# Patient Record
Sex: Male | Born: 1990 | Race: White | Hispanic: No | Marital: Married | Smoking: Never smoker
Health system: Southern US, Community
[De-identification: ages and names within clinical notes are randomized; demographics above are authoritative.]

## PROBLEM LIST (undated history)

## (undated) DIAGNOSIS — L309 Dermatitis, unspecified: Secondary | ICD-10-CM

---

## 2013-03-16 ENCOUNTER — Encounter (HOSPITAL_COMMUNITY): Payer: Self-pay | Admitting: Emergency Medicine

## 2013-03-16 ENCOUNTER — Emergency Department (INDEPENDENT_AMBULATORY_CARE_PROVIDER_SITE_OTHER)
Admission: EM | Admit: 2013-03-16 | Discharge: 2013-03-16 | Disposition: A | Discharge status: Home / Self Care | Payer: BC Managed Care – PPO | Source: Home / Self Care | Attending: Emergency Medicine | Admitting: Emergency Medicine

## 2013-03-16 DIAGNOSIS — M545 Low back pain, unspecified: Secondary | ICD-10-CM

## 2013-03-16 LAB — POCT URINALYSIS DIP (DEVICE)
Glucose, UA: NEGATIVE mg/dL
Hgb urine dipstick: NEGATIVE
Ketones, ur: >=160 mg/dL — AB
Leukocytes, UA: NEGATIVE
Nitrite: NEGATIVE
Protein, ur: NEGATIVE mg/dL
Specific Gravity, Urine: 1.025 (ref 1.005–1.030)
Urobilinogen, UA: 0.2 mg/dL (ref 0.0–1.0)
pH: 6 (ref 5.0–8.0)

## 2013-03-16 MED ORDER — HYDROCODONE-ACETAMINOPHEN 5-325 MG PO TABS
2.0000 | ORAL_TABLET | Freq: Once | ORAL | Status: AC
  Administered 2013-03-16: 2 via ORAL

## 2013-03-16 MED ORDER — CYCLOBENZAPRINE HCL 5 MG PO TABS: 5.0000 mg | ORAL_TABLET | Freq: Three times a day (TID) | ORAL | Status: DC | PRN

## 2013-03-16 MED ORDER — KETOROLAC TROMETHAMINE 60 MG/2ML IM SOLN
60.0000 mg | Freq: Once | INTRAMUSCULAR | Status: AC
  Administered 2013-03-16: 60 mg via INTRAMUSCULAR

## 2013-03-16 MED ORDER — MELOXICAM 15 MG PO TABS: 15.0000 mg | ORAL_TABLET | Freq: Every day | ORAL | Status: DC

## 2013-03-16 MED ORDER — OXYCODONE-ACETAMINOPHEN 5-325 MG PO TABS: ORAL_TABLET | ORAL | Status: DC

## 2013-03-16 MED ORDER — HYDROCODONE-ACETAMINOPHEN 5-325 MG PO TABS
ORAL_TABLET | ORAL | Status: AC
  Filled 2013-03-16: qty 2

## 2013-03-16 MED ORDER — KETOROLAC TROMETHAMINE 60 MG/2ML IM SOLN
INTRAMUSCULAR | Status: AC
  Filled 2013-03-16: qty 2

## 2013-03-16 NOTE — Discharge Instructions (Signed)
Do exercises twice daily followed by moist heat for 15 minutes. ° ° ° ° ° °Try to be as active as possible. ° °If no better in 2 weeks, follow up with orthopedist. ° ° °

## 2013-03-16 NOTE — ED Provider Notes (Signed)
Chief Complaint   Chief Complaint  Patient presents with  . 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 He denies any specific injury. The pain does not radiate. There is no numbness, tingling, weakness in the lower extremities. It hurts to bend. He denies any bladder or bowel dysfunction, abdominal pain, fever, weight loss. He has had some chills and sweats and noted a small amount of staining with urination but no blood in the urine or frequency. He does not have any prior history of back problems.  Review of Systems   Other than as noted above, the patient denies any of the following symptoms: Systemic:  No fever, chills, or unexplained weight loss. GI:  No abdominal painor incontinence of bowel. GU:  No dysuria, frequency, urgency, or hematuria. No incontinence of urine or urinary retention.  M-S:  No neck pain or arthritis. Neuro:  No paresthesias, headache, saddle anesthesia, muscular weakness, or progressive neurological deficit.  PMFSH   Past medical history, family history, social history, meds, and allergies were reviewed. Specifically, there is no history of cancer, major trauma, osteoporosis, immunosuppression, or HIV infection.   Physical Examination    Vital signs:  BP 128/76  Pulse 101  Temp(Src) 98.2 F (36.8 C) (Oral)  Resp 18  SpO2 100% General:  Alert, oriented, in no distress. Abdomen:  Soft, non-tender.  No organomegaly or mass.  No pulsatile midline abdominal mass or bruit. Back:  There is localized tenderness to palpation in the midline and lower back at the L4-S1 level. There is no swelling. His back has a limited range of motion with 15 of flexion, 10 of extension, 15 of lateral flexion to the right, 30 of lateral flexion to the left, and 30 of rotation in either direction straight leg raising revealed tight muscles and pain in the  lower back on the left side. Was completely negative on the right. Neuro:  Normal muscle strength, sensations and DTRs. Extremities: Pedal pulses were full, there was no edema. Skin:  Clear, warm and dry.  No rash.  Labs   Results for orders placed during the hospital encounter of 03/16/13  POCT URINALYSIS DIP (DEVICE)      Result Value Ref Range   Glucose, UA NEGATIVE  NEGATIVE mg/dL   Bilirubin Urine MODERATE (*) NEGATIVE   Ketones, ur >=160 (*) NEGATIVE mg/dL   Specific Gravity, Urine 1.025  1.005 - 1.030   Hgb urine dipstick NEGATIVE  NEGATIVE   pH 6.0  5.0 - 8.0   Protein, ur NEGATIVE  NEGATIVE mg/dL   Urobilinogen, UA 0.2  0.0 - 1.0 mg/dL   Nitrite NEGATIVE  NEGATIVE   Leukocytes, UA NEGATIVE  NEGATIVE    Course in [facility]gent [facility]   Given Toradol 60 mg IM and Norco 5/325 2 by mouth for pain.    Assessment   The encounter diagnosis was Lumbago.  No evidence of cauda equina syndrome.  Plan     1.  Meds:  The following meds were prescribed:   Discharge Medication List as of 03/16/2013  7:51 PM    START taking these medications   Details  cyclobenzaprine (FLEXERIL) 5 MG tablet Take 1 tablet (5 mg total) by mouth 3 (three) times daily as needed for muscle spasms., Starting 03/16/2013, Until Discontinued, Normal    meloxicam (MOBIC) 15 MG tablet Take 1 tablet (15  mg total) by mouth daily., Starting 03/16/2013, Until Discontinued, Normal    oxyCODONE-acetaminophen (PERCOCET) 5-325 MG per tablet 1 to 2 tablets every 6 hours as needed for pain., Print        2.  Patient Education/Counseling:  The patient was given appropriate handouts, self care instructions, and instructed in symptomatic relief. The patient was encouraged to try to be as active as possible and given some exercises to do followed by moist heat.  3.  Follow up:  The patient was told to follow up here if no better in 3 to 4 days, or sooner if becoming worse in any way, and given some red flag symptoms such  as worsening pain or new neurological symptoms which would prompt immediate return.  Follow up with Dr. Myrene Galas if no better in 2 weeks.     Reuben Likes, MD 03/16/13 2007

## 2013-03-16 NOTE — ED Notes (Signed)
Pt triaged and assessed by provider.   Provider in before nurse. 

## 2015-06-16 ENCOUNTER — Encounter (HOSPITAL_COMMUNITY): Payer: Self-pay | Admitting: Emergency Medicine

## 2015-06-16 ENCOUNTER — Emergency Department (HOSPITAL_COMMUNITY): Payer: BLUE CROSS/BLUE SHIELD

## 2015-06-16 ENCOUNTER — Emergency Department (HOSPITAL_COMMUNITY)
Admission: EM | Admit: 2015-06-16 | Discharge: 2015-06-16 | Disposition: A | Discharge status: Home / Self Care | Payer: BLUE CROSS/BLUE SHIELD | Attending: Emergency Medicine | Admitting: Emergency Medicine

## 2015-06-16 DIAGNOSIS — Y9234 Swimming pool (public) as the place of occurrence of the external cause: Secondary | ICD-10-CM | POA: Insufficient documentation

## 2015-06-16 DIAGNOSIS — S0093XA Contusion of unspecified part of head, initial encounter: Secondary | ICD-10-CM

## 2015-06-16 DIAGNOSIS — Y999 Unspecified external cause status: Secondary | ICD-10-CM | POA: Insufficient documentation

## 2015-06-16 DIAGNOSIS — Z79899 Other long term (current) drug therapy: Secondary | ICD-10-CM | POA: Insufficient documentation

## 2015-06-16 DIAGNOSIS — S0990XA Unspecified injury of head, initial encounter: Secondary | ICD-10-CM | POA: Diagnosis present

## 2015-06-16 DIAGNOSIS — Y939 Activity, unspecified: Secondary | ICD-10-CM | POA: Insufficient documentation

## 2015-06-16 DIAGNOSIS — S01511A Laceration without foreign body of lip, initial encounter: Secondary | ICD-10-CM | POA: Insufficient documentation

## 2015-06-16 DIAGNOSIS — S022XXA Fracture of nasal bones, initial encounter for closed fracture: Secondary | ICD-10-CM | POA: Insufficient documentation

## 2015-06-16 MED ORDER — LIDOCAINE HCL 1 % IJ SOLN
5.0000 mL | Freq: Once | INTRAMUSCULAR | Status: AC
  Administered 2015-06-16: 5 mL
  Filled 2015-06-16: qty 20

## 2015-06-16 MED ORDER — HYDROCODONE-ACETAMINOPHEN 5-325 MG PO TABS: 2.0000 | ORAL_TABLET | ORAL | Status: DC | PRN

## 2015-06-16 MED ORDER — LIDOCAINE-EPINEPHRINE-TETRACAINE (LET) SOLUTION
3.0000 mL | Freq: Once | NASAL | Status: AC
  Administered 2015-06-16: 3 mL via TOPICAL
  Filled 2015-06-16 (×2): qty 3

## 2015-06-16 MED ORDER — HYDROCODONE-ACETAMINOPHEN 5-325 MG PO TABS
1.0000 | ORAL_TABLET | Freq: Once | ORAL | Status: AC
  Administered 2015-06-16: 1 via ORAL
  Filled 2015-06-16: qty 1
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
## 2015-06-16 NOTE — Discharge Instructions (Signed)
Medications: Norco  Treatment: Try to keep your lip clean and dry for the first 24 hours. After the first 24 hours, wash with warm soapy water. Apply bacitracin ointment 1-2 times daily. Take ibuprofen every 4-6 hours as needed for more mild pain. Take Norco every 4-6 hours as needed for more severe pain.  Follow-up: Please see you doctor or return to emergency department in 5-7 days to have your sutures removed. Please return to emergency department if he develop any new or worsening symptoms.   Mouth Laceration A mouth laceration is a deep cut in the lining of your mouth (mucosa). The laceration may extend into your lip or go all of the way through your mouth and cheek. Lacerations inside your mouth may involve your tongue, the insides of your cheeks, or the upper surface of your mouth (palate). Mouth lacerations may bleed a lot because your mouth has a very rich blood supply. Mouth lacerations may need to be repaired with stitches (sutures). CAUSES Any type of facial injury can cause a mouth laceration. Common causes include:  Getting hit in the mouth.  Being in a car accident. SYMPTOMS The most common sign of a mouth laceration is bleeding that fills the mouth. DIAGNOSIS Your health care provider can diagnose a mouth laceration by examining your mouth. Your mouth may need to be washed out (irrigated) with a sterile salt-water (saline) solution. Your health care provider may also have to remove any blood clots to determine how bad your injury is. You may need X-rays of the bones in your jaw or your face to rule out other injuries, such as dental injuries, facial fractures, or jaw fractures. TREATMENT Treatment depends on the location and severity of your injury. Small mouth lacerations may not need treatment if bleeding has stopped. You may need sutures if:  You have a tongue laceration.  Your mouth laceration is large or deep, or it continues to bleed. If sutures are necessary, your  health care provider will use absorbable sutures that dissolve as your body heals. You may also receive antibiotic medicine or a tetanus shot. HOME CARE INSTRUCTIONS  Take medicines only as directed by your health care provider.  If you were prescribed an antibiotic medicine, finish all of it even if you start to feel better.  Eat as directed by your health care provider. You may only be able to drink liquids or eat soft foods for a few days.  Rinse your mouth with a warm, salt-water rinse 4-6 times per day or as directed by your health care provider. You can make a salt-water rinse by mixing one tsp of salt into two cups of warm water.  Do not poke the sutures with your tongue. Doing that can loosen them.  Check your wound every day for signs of infection. It is normal to have a white or gray patch over your wound while it heals. Watch for:  Redness.  Swelling.  Blood or pus.  Maintain regular oral hygiene, if possible. Gently brush your teeth with a soft, nylon-bristled toothbrush 2 times per day.  Keep all follow-up visits as directed by your health care provider. This is important. SEEK MEDICAL CARE IF:  You were given a tetanus shot and have swelling, severe pain, redness, or bleeding at the injection site.  You have a fever.  Your pain is not controlled with medicine.  You have redness, swelling, or pain at your wound that is getting worse.  You have fresh bleeding or pus coming  from your wound.  The edges of your wound break open.  You develop swollen, tender glands in your throat. SEEK IMMEDIATE MEDICAL CARE IF:   Your face or the area under your jaw becomes swollen.  You have trouble breathing or swallowing.   This information is not intended to replace advice given to you by your health care provider. Make sure you discuss any questions you have with your health care provider.   Document Released: 01/17/2005 Document Revised: 06/03/2014 Document Reviewed:  01/08/2014 Elsevier Interactive Patient Education 2016 [employer]  Laceration Care, Adult A laceration is a cut that goes through all of the layers of the skin and into the tissue that is right under the skin. Some lacerations heal on their own. Others need to be closed with stitches (sutures), staples, skin adhesive strips, or skin glue. Proper laceration care minimizes the risk of infection and helps the laceration to heal better. HOW TO CARE FOR YOUR LACERATION If sutures or staples were used:  Keep the wound clean and dry.  If you were given a bandage (dressing), you should change it at least one time per day or as told by your health care provider. You should also change it if it becomes wet or dirty.  Keep the wound completely dry for the first 24 hours or as told by your health care provider. After that time, you may shower or bathe. However, make sure that the wound is not soaked in water until after the sutures or staples have been removed.  Clean the wound one time each day or as told by your health care provider:  Wash the wound with soap and water.  Rinse the wound with water to remove all soap.  Pat the wound dry with a clean towel. Do not rub the wound.  After cleaning the wound, apply a thin layer of antibiotic ointmentas told by your health care provider. This will help to prevent infection and keep the dressing from sticking to the wound.  Have the sutures or staples removed as told by your health care provider. If skin adhesive strips were used:  Keep the wound clean and dry.  If you were given a bandage (dressing), you should change it at least one time per day or as told by your health care provider. You should also change it if it becomes dirty or wet.  Do not get the skin adhesive strips wet. You may shower or bathe, but be careful to keep the wound dry.  If the wound gets wet, pat it dry with a clean towel. Do not rub the wound.  Skin adhesive strips  fall off on their own. You may trim the strips as the wound heals. Do not remove skin adhesive strips that are still stuck to the wound. They will fall off in time. If skin glue was used:  Try to keep the wound dry, but you may briefly wet it in the shower or bath. Do not soak the wound in water, such as by swimming.  After you have showered or bathed, gently pat the wound dry with a clean towel. Do not rub the wound.  Do not do any activities that will make you sweat heavily until the skin glue has fallen off on its own.  Do not apply liquid, cream, or ointment medicine to the wound while the skin glue is in place. Using those may loosen the film before the wound has healed.  If you were given a bandage (dressing),  you should change it at least one time per day or as told by your health care provider. You should also change it if it becomes dirty or wet.  If a dressing is placed over the wound, be careful not to apply tape directly over the skin glue. Doing that may cause the glue to be pulled off before the wound has healed.  Do not pick at the glue. The skin glue usually remains in place for 5-10 days, then it falls off of the skin. General Instructions  Take over-the-counter and prescription medicines only as told by your health care provider.  If you were prescribed an antibiotic medicine or ointment, take or apply it as told by your doctor. Do not stop using it even if your condition improves.  To help prevent scarring, make sure to cover your wound with sunscreen whenever you are outside after stitches are removed, after adhesive strips are removed, or when glue remains in place and the wound is healed. Make sure to wear a sunscreen of at least 30 SPF.  Do not scratch or pick at the wound.  Keep all follow-up visits as told by your health care provider. This is important.  Check your wound every day for signs of infection. Watch for:  Redness, swelling, or pain.  Fluid, blood,  or pus.  Raise (elevate) the injured area above the level of your heart while you are sitting or lying down, if possible. SEEK MEDICAL CARE IF:  You received a tetanus shot and you have swelling, severe pain, redness, or bleeding at the injection site.  You have a fever.  A wound that was closed breaks open.  You notice a bad smell coming from your wound or your dressing.  You notice something coming out of the wound, such as wood or glass.  Your pain is not controlled with medicine.  You have increased redness, swelling, or pain at the site of your wound.  You have fluid, blood, or pus coming from your wound.  You notice a change in the color of your skin near your wound.  You need to change the dressing frequently due to fluid, blood, or pus draining from the wound.  You develop a new rash.  You develop numbness around the wound. SEEK IMMEDIATE MEDICAL CARE IF:  You develop severe swelling around the wound.  Your pain suddenly increases and is severe.  You develop painful lumps near the wound or on skin that is anywhere on your body.  You have a red streak going away from your wound.  The wound is on your hand or foot and you cannot properly move a finger or toe.  The wound is on your hand or foot and you notice that your fingers or toes look pale or bluish.   This information is not intended to replace advice given to you by your health care provider. Make sure you discuss any questions you have with your health care provider.   Document Released: 01/17/2005 Document Revised: 06/03/2014 Document Reviewed: 01/13/2014 Elsevier Interactive Patient Education [employer]
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
## 2015-08-29 ENCOUNTER — Encounter (HOSPITAL_COMMUNITY): Payer: Self-pay | Admitting: *Deleted

## 2015-08-29 ENCOUNTER — Ambulatory Visit (HOSPITAL_COMMUNITY)
Admission: EM | Admit: 2015-08-29 | Discharge: 2015-08-29 | Disposition: A | Discharge status: Home / Self Care | Payer: BLUE CROSS/BLUE SHIELD | Attending: Emergency Medicine | Admitting: Emergency Medicine

## 2015-08-29 DIAGNOSIS — E860 Dehydration: Secondary | ICD-10-CM | POA: Diagnosis not present

## 2015-08-29 MED ORDER — IBUPROFEN 800 MG PO TABS
ORAL_TABLET | ORAL | Status: AC
  Filled 2015-08-29: qty 1

## 2015-08-29 MED ORDER — ONDANSETRON HCL 4 MG/2ML IJ SOLN
INTRAMUSCULAR | Status: AC
  Filled 2015-08-29: qty 2

## 2015-08-29 MED ORDER — ONDANSETRON HCL 4 MG/2ML IJ SOLN: 4.0000 mg | Freq: Once | INTRAMUSCULAR | Status: DC

## 2015-08-29 MED ORDER — ONDANSETRON HCL 4 MG/2ML IJ SOLN
4.0000 mg | Freq: Once | INTRAMUSCULAR | Status: AC
  Administered 2015-08-29: 4 mg via INTRAVENOUS

## 2015-08-29 MED ORDER — IBUPROFEN 800 MG PO TABS
800.0000 mg | ORAL_TABLET | Freq: Once | ORAL | Status: AC
  Administered 2015-08-29: 800 mg via ORAL

## 2015-08-29 MED ORDER — ONDANSETRON 8 MG PO TBDP: 8.0000 mg | ORAL_TABLET | Freq: Three times a day (TID) | ORAL | 0 refills | Status: DC | PRN

## 2015-08-29 MED ORDER — SODIUM CHLORIDE 0.9 % IV BOLUS (SEPSIS)
1000.0000 mL | Freq: Once | INTRAVENOUS | Status: AC
  Administered 2015-08-29: 1000 mL via INTRAVENOUS

## 2015-08-29 NOTE — ED Triage Notes (Signed)
Pt  Reports    Feels    Weak    No   Vomiting  No  Diarrhea
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
## 2020-07-29 ENCOUNTER — Emergency Department (INDEPENDENT_AMBULATORY_CARE_PROVIDER_SITE_OTHER): Payer: Managed Care, Other (non HMO)

## 2020-07-29 ENCOUNTER — Emergency Department (INDEPENDENT_AMBULATORY_CARE_PROVIDER_SITE_OTHER)
Admission: EM | Admit: 2020-07-29 | Discharge: 2020-07-29 | Disposition: A | Discharge status: Home / Self Care | Payer: Managed Care, Other (non HMO) | Source: Home / Self Care

## 2020-07-29 ENCOUNTER — Other Ambulatory Visit: Payer: Self-pay

## 2020-07-29 DIAGNOSIS — M62830 Muscle spasm of back: Secondary | ICD-10-CM

## 2020-07-29 DIAGNOSIS — M545 Low back pain, unspecified: Secondary | ICD-10-CM

## 2020-07-29 DIAGNOSIS — S39012A Strain of muscle, fascia and tendon of lower back, initial encounter: Secondary | ICD-10-CM

## 2020-07-29 DIAGNOSIS — M5489 Other dorsalgia: Secondary | ICD-10-CM

## 2020-07-29 MED ORDER — PREDNISONE 20 MG PO TABS: ORAL_TABLET | ORAL | 0 refills | Status: DC

## 2020-07-29 MED ORDER — BACLOFEN 10 MG PO TABS: 10.0000 mg | ORAL_TABLET | Freq: Three times a day (TID) | ORAL | 0 refills | Status: DC

## 2020-07-29 NOTE — ED Triage Notes (Signed)
Pt c/o lower back pain x 1 hour when he was walking his dog, came home and sat down and felt something pull. Immediate excruciating pain. 400mg  motrin taken. Pain 7/10

## 2020-07-29 NOTE — Discharge Instructions (Addendum)
Instructed/advised patient to take medication as directed with food to completion.  Encourage patient to increase daily water intake while taking these medications.  Encouraged patient to avoid moderate to strenuous activity or repetitive motions involving affected area of mid to lower back for the next 7 to 10 days.

## 2020-07-29 NOTE — ED Provider Notes (Signed)
[facility]    CSN: [id number] Arrival date & time: 07/29/20  0834      History   Chief Complaint Chief Complaint  Patient presents with   Back Pain    HPI Brendan Morgan is a 30 y.o. male.   HPI 30 year old male presents with lower back pain for 1 hour reports was walking his dog he came home and felt something pull, reports immediate excruciating pain and taking OTC Motrin 400 mg, currently reports lower back pain 7 of 10.  History reviewed. No pertinent past medical history.  There are no problems to display for this patient.   History reviewed. No pertinent surgical history.     Home Medications    Prior to Admission medications   Medication Sig Start Date End Date Taking? Authorizing Provider  baclofen (LIORESAL) 10 MG tablet Take 1 tablet (10 mg total) by mouth 3 (three) times daily. 07/29/20  Yes Trevor Iha, FNP  predniSONE (DELTASONE) 20 MG tablet Take 3 tabs PO daily x 3 days, then 2 tabs PO daily x 3 days, then 1 tab PO daily x 3 days 07/29/20  Yes Trevor Iha, FNP  HYDROcodone-acetaminophen (NORCO/VICODIN) 5-325 MG tablet Take 2 tablets by mouth every 4 (four) hours as needed. 06/16/15   Law, Waylan Boga, PA-C  Lisdexamfetamine Dimesylate (VYVANSE PO) Take by mouth.    [provider]  meloxicam (MOBIC) 15 MG tablet Take 1 tablet (15 mg total) by mouth daily. 03/16/13   Reuben Likes, MD  ondansetron (ZOFRAN ODT) 8 MG disintegrating tablet Take 1 tablet (8 mg total) by mouth every 8 (eight) hours as needed for nausea or vomiting. 08/29/15   Lucia Estelle, NP  oxyCODONE-acetaminophen (PERCOCET) 5-325 MG per tablet 1 to 2 tablets every 6 hours as needed for pain. 03/16/13   Reuben Likes, MD    Family History History reviewed. No pertinent family history.  Social History Social History   Tobacco Use   Smoking status: Never   Smokeless tobacco: Never  Substance Use Topics   Alcohol use: Yes   Drug use: No     Allergies   Patient  has no known allergies.   Review of Systems Review of Systems  Musculoskeletal:  Positive for back pain.  All other systems reviewed and are negative.   Physical Exam Triage Vital Signs ED Triage Vitals [07/29/20 0851]  Enc Vitals Group     BP 116/79     Pulse Rate 79     Resp 18     Temp 98.3 F (36.8 C)     Temp Source Oral     SpO2 95 %     Weight      Height      Head Circumference      Peak Flow      Pain Score 7     Pain Loc      Pain Edu?      Excl. in GC?    No data found.  Updated Vital Signs BP 116/79 (BP Location: Left Arm)   Pulse 79   Temp 98.3 F (36.8 C) (Oral)   Resp 18   SpO2 95%   Physical Exam Vitals and nursing note reviewed.  Constitutional:      General: He is not in acute distress.    Appearance: Normal appearance. He is normal weight. He is ill-appearing.  HENT:     Head: Normocephalic and atraumatic.  Eyes:     Extraocular Movements: Extraocular movements  intact.     Conjunctiva/sclera: Conjunctivae normal.     Pupils: Pupils are equal, round, and reactive to light.  Cardiovascular:     Rate and Rhythm: Normal rate and regular rhythm.     Pulses: Normal pulses.     Heart sounds: Normal heart sounds.  Pulmonary:     Effort: Pulmonary effort is normal.     Breath sounds: Normal breath sounds. No wheezing, rhonchi or rales.  Musculoskeletal:     Cervical back: Normal range of motion and neck supple. No rigidity.     Comments: Inferior thoracic spine/superior LS-spine: TTP over left-sided paraspinous muscles and left-sided superior spinal erector, palpable muscle adhesions noted, exam limited due to pain  Skin:    General: Skin is warm and dry.  Neurological:     General: No focal deficit present.     Mental Status: He is alert and oriented to person, place, and time.  Psychiatric:        Mood and Affect: Mood normal.        Behavior: Behavior normal.     UC Treatments / Results  Labs (all labs ordered are listed, but only  abnormal results are displayed) Labs Reviewed - No data to display  EKG   Radiology DG Thoracic Spine 2 View  Result Date: 07/29/2020 CLINICAL DATA:  Mid back pain, initial encounter EXAM: THORACIC SPINE 2 VIEWS COMPARISON:  None. FINDINGS: Vertebral body height is well maintained. No pedicle abnormality or paraspinal mass is seen. Ribs are within normal limits. IMPRESSION: No acute abnormality noted. Electronically Signed   By: Alcide Clever M.D.   On: 07/29/2020 09:38   DG Lumbar Spine Complete  Result Date: 07/29/2020 CLINICAL DATA:  Low back pain, initial encounter EXAM: LUMBAR SPINE - COMPLETE 4+ VIEW COMPARISON:  None. FINDINGS: Five lumbar type vertebral bodies are well visualized. No pars defects are noted. No anterolisthesis is seen. Disc space narrowing is noted at L4-5. No soft tissue abnormality is seen. IMPRESSION: Mild degenerative change without acute abnormality. Electronically Signed   By: Alcide Clever M.D.   On: 07/29/2020 09:41    Procedures Procedures (including critical care time)  Medications Ordered in UC Medications - No data to display  Initial Impression / Assessment and Plan / UC Course  I have reviewed the triage vital signs and the nursing notes.  Pertinent labs & imaging results that were available during my care of the patient were reviewed by me and considered in my medical decision making (see chart for details).     MDM: 1.  Strain of lumbar region initial encounter-prednisone taper for the next 9 days, 2.  Muscle spasm of back-baclofen daily, as needed Final Clinical Impressions(s) / UC Diagnoses   Final diagnoses:  Strain of lumbar region, initial encounter  Muscle spasm of back     Discharge Instructions      Instructed/advised patient to take medication as directed with food to completion.  Encourage patient to increase daily water intake while taking these medications.  Encouraged patient to avoid moderate to strenuous activity or  repetitive motions involving affected area of mid to lower back for the next 7 to 10 days.     ED Prescriptions     Medication Sig Dispense Auth. Provider   predniSONE (DELTASONE) 20 MG tablet Take 3 tabs PO daily x 3 days, then 2 tabs PO daily x 3 days, then 1 tab PO daily x 3 days 18 tablet Trevor Iha, FNP   baclofen (LIORESAL)  10 MG tablet Take 1 tablet (10 mg total) by mouth 3 (three) times daily. 30 each Trevor Iha, FNP      PDMP not reviewed this encounter.   Trevor Iha, FNP 07/29/20 1003

## 2022-12-10 ENCOUNTER — Ambulatory Visit
Admission: EM | Admit: 2022-12-10 | Discharge: 2022-12-10 | Disposition: A | Discharge status: Home / Self Care | Payer: Managed Care, Other (non HMO) | Attending: Family Medicine | Admitting: Family Medicine

## 2022-12-10 ENCOUNTER — Other Ambulatory Visit: Payer: Self-pay

## 2022-12-10 ENCOUNTER — Telehealth: Payer: Self-pay

## 2022-12-10 DIAGNOSIS — M545 Low back pain, unspecified: Secondary | ICD-10-CM

## 2022-12-10 DIAGNOSIS — M62830 Muscle spasm of back: Secondary | ICD-10-CM

## 2022-12-10 MED ORDER — TIZANIDINE HCL 4 MG PO TABS: 4.0000 mg | ORAL_TABLET | Freq: Four times a day (QID) | ORAL | 0 refills | Status: DC | PRN

## 2022-12-10 MED ORDER — HYDROCODONE-ACETAMINOPHEN 7.5-325 MG PO TABS: 1.0000 | ORAL_TABLET | Freq: Four times a day (QID) | ORAL | 0 refills | Status: DC | PRN

## 2022-12-10 MED ORDER — KETOROLAC TROMETHAMINE 30 MG/ML IJ SOLN
60.0000 mg | Freq: Once | INTRAMUSCULAR | Status: AC
  Administered 2022-12-10: 60 mg via INTRAMUSCULAR

## 2022-12-10 MED ORDER — PREDNISONE 10 MG (21) PO TBPK: ORAL_TABLET | Freq: Every day | ORAL | 0 refills | Status: DC

## 2022-12-10 NOTE — Discharge Instructions (Signed)
Activity as tolerated Use ice or heat to painful back muscles Take the prednisone pack as directed.  The higher doses may cause you to feel jittery, but the dose is decreased every day Take the muscle relaxer as needed.  1 or 2 pills 3 times a day Take the hydrocodone as needed.  Do not drive on hydrocodone Call for problems Consider sports medicine consultation and follow-up

## 2022-12-10 NOTE — ED Triage Notes (Signed)
Low back pain since playing basketball last night

## 2022-12-10 NOTE — ED Provider Notes (Signed)
[facility]    CSN: [id number] Arrival date & time: 12/10/22  0841      History   Chief Complaint Chief Complaint  Patient presents with   Back Pain    HPI Brendan Morgan is a 32 y.o. male.   This is a healthy 32 year old gentleman who injured his back yesterday playing basketball.  He states that he jumped up for a lay up, twisted, and felt a pain in his back as soon as he landed.  He states that the back pain at this time is excruciating.  He can hardly move.  He could not sleep last night.  He comes in in a wheelchair.  No radiation of pain.  No numbness or weakness.  No bowel or bladder problems.  He states he has recurring back pain problems.  He had x-rays done and was told he had mild disc problems.  I reviewed these x-rays and he does have a minor narrowing at the L4-5 disc space.  Patient states his pain is excruciating.  He can hardly walk or move.  He grimaces with any cough, sneeze, laugh.  His wife accompanies him.     History reviewed. No pertinent past medical history.  There are no problems to display for this patient.   History reviewed. No pertinent surgical history. Reviewed with patient.  He is healthy and on no medications    Home Medications    Prior to Admission medications   Medication Sig Start Date End Date Taking? Authorizing Provider  HYDROcodone-acetaminophen (NORCO) 7.5-325 MG tablet Take 1 tablet by mouth every 6 (six) hours as needed for moderate pain (pain score 4-6). 12/10/22  Yes Eustace Moore, MD  ibuprofen (ADVIL) 400 MG tablet Take 400 mg by mouth every 6 (six) hours as needed.   Yes [provider]  predniSONE (STERAPRED UNI-PAK 21 TAB) 10 MG (21) TBPK tablet Take by mouth daily. tad 12/10/22  Yes Eustace Moore, MD  tiZANidine (ZANAFLEX) 4 MG tablet Take 1-2 tablets (4-8 mg total) by mouth every 6 (six) hours as needed for muscle spasms. 12/10/22  Yes Eustace Moore, MD    Family History History reviewed.  No pertinent family history.  Social History Social History   Tobacco Use   Smoking status: Never   Smokeless tobacco: Never  Substance Use Topics   Alcohol use: Yes   Drug use: No     Allergies   Patient has no known allergies.   Review of Systems Review of Systems See HPI  Physical Exam Triage Vital Signs ED Triage Vitals  Encounter Vitals Group     BP 12/10/22 0849 122/84     Systolic BP Percentile --      Diastolic BP Percentile --      Pulse Rate 12/10/22 0849 68     Resp 12/10/22 0849 16     Temp 12/10/22 0849 98.4 F (36.9 C)     Temp src --      SpO2 12/10/22 0849 99 %     Weight --      Height --      Head Circumference --      Peak Flow --      Pain Score 12/10/22 0855 10     Pain Loc --      Pain Education --      Exclude from Growth Chart --    No data found.  Updated Vital Signs BP 122/84   Pulse 68  Temp 98.4 F (36.9 C)   Resp 16   SpO2 99%       Physical Exam Constitutional:      General: He is in acute distress.     Appearance: He is well-developed and normal weight.     Comments: Patient is in wheelchair.  He lives with great difficulty.  Examined in chair  HENT:     Head: Normocephalic and atraumatic.  Eyes:     Conjunctiva/sclera: Conjunctivae normal.     Pupils: Pupils are equal, round, and reactive to light.  Cardiovascular:     Rate and Rhythm: Normal rate.  Pulmonary:     Effort: Pulmonary effort is normal. No respiratory distress.  Musculoskeletal:        General: Tenderness present. Normal range of motion.     Cervical back: Normal range of motion.     Comments: Both lumbar column of muscles are tight and tender.  No bony tenderness  Skin:    General: Skin is warm and dry.  Neurological:     General: No focal deficit present.     Mental Status: He is alert.     Sensory: No sensory deficit.     Motor: No weakness.     Gait: Gait abnormal.     Deep Tendon Reflexes: Reflexes normal.      UC Treatments /  Results  Labs (all labs ordered are listed, but only abnormal results are displayed) Labs Reviewed - No data to display  EKG   Radiology No results found.  Procedures Procedures (including critical care time)  Medications Ordered in UC Medications  ketorolac (TORADOL) 30 MG/ML injection 60 mg (60 mg Intramuscular Given 12/10/22 0928)    Initial Impression / Assessment and Plan / UC Course  I have reviewed the triage vital signs and the nursing notes.  Pertinent labs & imaging results that were available during my care of the patient were reviewed by me and considered in my medical decision making (see chart for details).     I reviewed the chart with patient.  He has been on ibuprofen, Mobic, steroids, baclofen, hydrocodone, oxycodone, Flexeril.  He states the only medicine he recalls that was helpful was the hydrocodone.  I explained that this was appropriate only for short-term use, he voices understanding.  Follow-up with sports medicine is recommended Final Clinical Impressions(s) / UC Diagnoses   Final diagnoses:  Muscle spasm of back  Acute bilateral low back pain without sciatica     Discharge Instructions      Activity as tolerated Use ice or heat to painful back muscles Take the prednisone pack as directed.  The higher doses may cause you to feel jittery, but the dose is decreased every day Take the muscle relaxer as needed.  1 or 2 pills 3 times a day Take the hydrocodone as needed.  Do not drive on hydrocodone Call for problems Consider sports medicine consultation and follow-up   ED Prescriptions     Medication Sig Dispense Auth. Provider   tiZANidine (ZANAFLEX) 4 MG tablet Take 1-2 tablets (4-8 mg total) by mouth every 6 (six) hours as needed for muscle spasms. 21 tablet Eustace Moore, MD   predniSONE (STERAPRED UNI-PAK 21 TAB) 10 MG (21) TBPK tablet Take by mouth daily. tad 21 tablet Eustace Moore, MD   HYDROcodone-acetaminophen [facility])  7.5-325 MG tablet Take 1 tablet by mouth every 6 (six) hours as needed for moderate pain (pain score 4-6). 15 tablet Rica Mast  Fannie Knee, MD      I have reviewed the PDMP during this encounter.   Eustace Moore, MD 12/10/22 [phone]

## 2022-12-10 NOTE — Telephone Encounter (Signed)
Received call from patient to transfer rx for hydrocodone to [facility] in [place]

## 2023-11-01 ENCOUNTER — Ambulatory Visit
Admission: RE | Admit: 2023-11-01 | Discharge: 2023-11-01 | Disposition: A | Discharge status: Home / Self Care | Attending: Family Medicine | Admitting: Family Medicine

## 2023-11-01 VITALS — BP 124/79 | HR 79 | Temp 97.5°F | Resp 16

## 2023-11-01 DIAGNOSIS — S39012A Strain of muscle, fascia and tendon of lower back, initial encounter: Secondary | ICD-10-CM

## 2023-11-01 HISTORY — DX: Dermatitis, unspecified: L30.9

## 2023-11-01 MED ORDER — TIZANIDINE HCL 4 MG PO TABS
4.0000 mg | ORAL_TABLET | Freq: Four times a day (QID) | ORAL | 0 refills | Status: AC | PRN
Start: 2023-11-01 — End: ?

## 2023-11-01 MED ORDER — PREDNISONE 10 MG (21) PO TBPK: ORAL_TABLET | Freq: Every day | ORAL | 0 refills | Status: AC

## 2023-11-01 MED ORDER — HYDROCODONE-ACETAMINOPHEN 7.5-325 MG PO TABS: 1.0000 | ORAL_TABLET | Freq: Four times a day (QID) | ORAL | 0 refills | Status: AC | PRN

## 2023-11-01 MED ORDER — KETOROLAC TROMETHAMINE 30 MG/ML IJ SOLN
60.0000 mg | Freq: Once | INTRAMUSCULAR | Status: AC
  Administered 2023-11-01: 60 mg via INTRAMUSCULAR

## 2023-11-01 NOTE — ED Triage Notes (Signed)
 Thinks he hurt his back sneezing, was sneezing a lot last week. Lower right back pain for 2 days, worse since last night. Has had ibuprofen , which does not help. Standing helps the pain.

## 2023-11-01 NOTE — Discharge Instructions (Signed)
 Home to rest.  Activity as tolerated.  May use ice or heat to painful back area Take the prednisone  as directed.  Take all of day 1 today as a single dose when you get the prescription filled.  After this take on schedule Take tizanidine  as needed as muscle relaxer.  This may cause drowsiness.  It is beneficial to take 1 or 2 at bedtime Take hydrocodone  if needed for severe pain.  Do not drive on hydrocodone . See your primary care doctor if not improving in a few days.

## 2023-11-01 NOTE — ED Provider Notes (Signed)
 [facility]    CSN: [id number] Arrival date & time: 11/01/23  9170      History   Chief Complaint Chief Complaint  Patient presents with   Back Pain    Entered by patient    HPI Brendan Morgan is a 33 y.o. male.   Patient's had a couple of hours besides of severe back pain.  Unexplained.  I saw him for this about a year ago.  He is here today with severe back pain unclear etiology.  Has been present for 2 days.  He states that it is localized to the right side of his low back.  No radiation.  No numbness or weakness.  He states that he is immobilized due to the pain and had to have his wife help him get out of bed this morning.  Treatment last time he states was helpful.  He has not seen a back specialist.  He has had back x-rays in the past.    Past Medical History:  Diagnosis Date   Dermatitis     There are no active problems to display for this patient.   History reviewed. No pertinent surgical history.     Home Medications    Prior to Admission medications   Medication Sig Start Date End Date Taking? Authorizing Provider  HYDROcodone -acetaminophen  (NORCO) 7.5-325 MG tablet Take 1 tablet by mouth every 6 (six) hours as needed for moderate pain (pain score 4-6). 11/01/23   Maranda Jamee Jacob, MD  predniSONE  (STERAPRED UNI-PAK 21 TAB) 10 MG (21) TBPK tablet Take by mouth daily. tad 11/01/23   Maranda Jamee Jacob, MD  tiZANidine  (ZANAFLEX ) 4 MG tablet Take 1-2 tablets (4-8 mg total) by mouth every 6 (six) hours as needed for muscle spasms. 11/01/23   Maranda Jamee Jacob, MD    Family History History reviewed. No pertinent family history.  Social History Social History   Tobacco Use   Smoking status: Never   Smokeless tobacco: Never  Substance Use Topics   Alcohol use: Yes   Drug use: No     Allergies   Patient has no known allergies.   Review of Systems Review of Systems See HPI  Physical Exam Triage Vital Signs ED Triage Vitals  Encounter  Vitals Group     BP 11/01/23 0835 124/79     Girls Systolic BP Percentile --      Girls Diastolic BP Percentile --      Boys Systolic BP Percentile --      Boys Diastolic BP Percentile --      Pulse Rate 11/01/23 0835 79     Resp 11/01/23 0835 16     Temp 11/01/23 0835 (!) 97.5 F (36.4 C)     Temp src --      SpO2 11/01/23 0835 95 %     Weight --      Height --      Head Circumference --      Peak Flow --      Pain Score 11/01/23 0840 9     Pain Loc --      Pain Education --      Exclude from Growth Chart --    No data found.  Updated Vital Signs BP 124/79   Pulse 79   Temp (!) 97.5 F (36.4 C)   Resp 16   SpO2 95%       Physical Exam Constitutional:      General: He is in acute distress.  Appearance: He is normal weight.     Comments: Stiff guarded movements.  Obviously uncomfortable  Musculoskeletal:        General: Tenderness present.     Comments: Tender noticed in the right lower lumbar muscles as they inserted into the posterior pelvis.  Mild tenderness in the right SI region as well.  No palpable muscle spasm.  Range of motion severely limited.  Reflexes, straight leg raise are intact  Neurological:     Gait: Gait abnormal.     Comments: Small steps.  Slightly bent posture      UC Treatments / Results  Labs (all labs ordered are listed, but only abnormal results are displayed) Labs Reviewed - No data to display  EKG   Radiology No results found.  Procedures Procedures (including critical care time)  Medications Ordered in UC Medications  ketorolac  (TORADOL ) 30 MG/ML injection 60 mg (60 mg Intramuscular Given 11/01/23 0856)    Initial Impression / Assessment and Plan / UC Course  I have reviewed the triage vital signs and the nursing notes.  Pertinent labs & imaging results that were available during my care of the patient were reviewed by me and considered in my medical decision making (see chart for details).     Old x-rays are  reviewed.  Patient has Berry mild DJD This appears to be a muscular injury.  Discussed with patient Final Clinical Impressions(s) / UC Diagnoses   Final diagnoses:  Strain of lumbar region, initial encounter     Discharge Instructions      Home to rest.  Activity as tolerated.  May use ice or heat to painful back area Take the prednisone  as directed.  Take all of day 1 today as a single dose when you get the prescription filled.  After this take on schedule Take tizanidine  as needed as muscle relaxer.  This may cause drowsiness.  It is beneficial to take 1 or 2 at bedtime Take hydrocodone  if needed for severe pain.  Do not drive on hydrocodone . See your primary care doctor if not improving in a few days.   ED Prescriptions     Medication Sig Dispense Auth. Provider   HYDROcodone -acetaminophen  (NORCO) 7.5-325 MG tablet Take 1 tablet by mouth every 6 (six) hours as needed for moderate pain (pain score 4-6). 15 tablet Maranda Jamee Jacob, MD   predniSONE  (STERAPRED UNI-PAK 21 TAB) 10 MG (21) TBPK tablet Take by mouth daily. tad 21 tablet Maranda Jamee Jacob, MD   tiZANidine  (ZANAFLEX ) 4 MG tablet Take 1-2 tablets (4-8 mg total) by mouth every 6 (six) hours as needed for muscle spasms. 21 tablet Maranda Jamee Jacob, MD      PDMP not reviewed this encounter.   Maranda Jamee Jacob, MD 11/01/23 [phone]
